# Patient Record
Sex: Female | Born: 1935 | Race: White | Hispanic: No | Marital: Married
Health system: Southern US, Community
[De-identification: ages and names within clinical notes are randomized; demographics above are authoritative.]

---

## 2003-11-07 ENCOUNTER — Other Ambulatory Visit: Payer: Self-pay

## 2003-11-07 ENCOUNTER — Inpatient Hospital Stay: Payer: Self-pay | Admitting: Internal Medicine

## 2004-03-10 ENCOUNTER — Emergency Department: Payer: Self-pay | Admitting: Emergency Medicine

## 2004-04-14 ENCOUNTER — Ambulatory Visit: Payer: Self-pay | Admitting: Internal Medicine

## 2004-10-02 ENCOUNTER — Ambulatory Visit: Payer: Self-pay | Admitting: Internal Medicine

## 2006-09-09 ENCOUNTER — Ambulatory Visit: Payer: Self-pay | Admitting: Emergency Medicine

## 2006-09-09 ENCOUNTER — Other Ambulatory Visit: Payer: Self-pay

## 2006-09-09 ENCOUNTER — Emergency Department: Payer: Self-pay | Admitting: Emergency Medicine

## 2008-05-04 ENCOUNTER — Emergency Department: Payer: Self-pay | Admitting: Emergency Medicine

## 2008-12-11 ENCOUNTER — Emergency Department (HOSPITAL_COMMUNITY): Admission: EM | Admit: 2008-12-11 | Discharge: 2008-12-12 | Payer: Self-pay | Admitting: Emergency Medicine

## 2008-12-11 ENCOUNTER — Emergency Department: Payer: Self-pay | Admitting: Internal Medicine

## 2009-02-12 ENCOUNTER — Ambulatory Visit: Payer: Self-pay | Admitting: Internal Medicine

## 2009-02-12 ENCOUNTER — Observation Stay (HOSPITAL_COMMUNITY): Admission: EM | Admit: 2009-02-12 | Discharge: 2009-02-13 | Payer: Self-pay | Admitting: Emergency Medicine

## 2009-06-25 ENCOUNTER — Emergency Department (HOSPITAL_COMMUNITY): Admission: EM | Admit: 2009-06-25 | Discharge: 2009-06-25 | Payer: Self-pay | Admitting: Emergency Medicine

## 2009-06-26 ENCOUNTER — Emergency Department: Payer: Self-pay | Admitting: Emergency Medicine

## 2009-07-06 ENCOUNTER — Emergency Department: Payer: Self-pay | Admitting: Emergency Medicine

## 2009-08-19 ENCOUNTER — Emergency Department (HOSPITAL_COMMUNITY): Admission: EM | Admit: 2009-08-19 | Discharge: 2009-08-20 | Payer: Self-pay | Admitting: Emergency Medicine

## 2010-04-04 LAB — CBC
Hemoglobin: 12.4 g/dL (ref 12.0–15.0)
MCHC: 32.5 g/dL (ref 30.0–36.0)
Platelets: 265 10*3/uL (ref 150–400)
RBC: 4.07 MIL/uL (ref 3.87–5.11)
RDW: 15 % (ref 11.5–15.5)
WBC: 8.9 10*3/uL (ref 4.0–10.5)

## 2010-04-04 LAB — POCT I-STAT, CHEM 8
Calcium, Ion: 1.04 mmol/L — ABNORMAL LOW (ref 1.12–1.32)
Glucose, Bld: 102 mg/dL — ABNORMAL HIGH (ref 70–99)
HCT: 40 % (ref 36.0–46.0)
TCO2: 28 mmol/L (ref 0–100)

## 2010-04-04 LAB — URINALYSIS, ROUTINE W REFLEX MICROSCOPIC
Glucose, UA: NEGATIVE mg/dL
Nitrite: NEGATIVE
Specific Gravity, Urine: 1.007 (ref 1.005–1.030)
pH: 8 (ref 5.0–8.0)

## 2010-04-04 LAB — DIFFERENTIAL
Lymphocytes Relative: 18 % (ref 12–46)
Lymphs Abs: 1.6 10*3/uL (ref 0.7–4.0)
Neutro Abs: 6.4 10*3/uL (ref 1.7–7.7)
Neutrophils Relative %: 72 % (ref 43–77)

## 2010-04-04 LAB — POCT CARDIAC MARKERS
CKMB, poc: 1.4 ng/mL (ref 1.0–8.0)
Myoglobin, poc: 69.9 ng/mL (ref 12–200)
Troponin i, poc: <0.05 ng/mL (ref 0.00–0.09)

## 2010-04-07 LAB — URINALYSIS, ROUTINE W REFLEX MICROSCOPIC
Bilirubin Urine: NEGATIVE
Bilirubin Urine: NEGATIVE
Glucose, UA: NEGATIVE mg/dL
Hgb urine dipstick: NEGATIVE
Ketones, ur: NEGATIVE mg/dL
Ketones, ur: NEGATIVE mg/dL
Nitrite: POSITIVE — AB
Protein, ur: NEGATIVE mg/dL
Specific Gravity, Urine: 1.014 (ref 1.005–1.030)
Urobilinogen, UA: 1 mg/dL (ref 0.0–1.0)
pH: 7.5 (ref 5.0–8.0)

## 2010-04-07 LAB — LIPID PANEL
Cholesterol: 233 mg/dL — ABNORMAL HIGH (ref 0–200)
LDL Cholesterol: 148 mg/dL — ABNORMAL HIGH (ref 0–99)
Total CHOL/HDL Ratio: 3.1 RATIO
VLDL: 10 mg/dL (ref 0–40)

## 2010-04-07 LAB — CBC
HCT: 36 % (ref 36.0–46.0)
Hemoglobin: 11.3 g/dL — ABNORMAL LOW (ref 12.0–15.0)
MCHC: 33.3 g/dL (ref 30.0–36.0)
MCV: 94.9 fL (ref 78.0–100.0)
MCV: 94 fL (ref 78.0–100.0)
Platelets: 233 10*3/uL (ref 150–400)
RBC: 3.61 MIL/uL — ABNORMAL LOW (ref 3.87–5.11)
RDW: 17.5 % — ABNORMAL HIGH (ref 11.5–15.5)
RDW: 14.8 % (ref 11.5–15.5)
WBC: 5.6 10*3/uL (ref 4.0–10.5)

## 2010-04-07 LAB — DIFFERENTIAL
Basophils Absolute: 0 10*3/uL (ref 0.0–0.1)
Eosinophils Absolute: 0.2 10*3/uL (ref 0.0–0.7)
Eosinophils Relative: 3 % (ref 0–5)
Lymphocytes Relative: 37 % (ref 12–46)
Monocytes Absolute: 0.5 10*3/uL (ref 0.1–1.0)

## 2010-04-07 LAB — POCT I-STAT, CHEM 8
BUN: 18 mg/dL (ref 6–23)
Creatinine, Ser: 0.6 mg/dL (ref 0.4–1.2)
Creatinine, Ser: 0.7 mg/dL (ref 0.4–1.2)
Glucose, Bld: 81 mg/dL (ref 70–99)
Glucose, Bld: 89 mg/dL (ref 70–99)
HCT: 36 % (ref 36.0–46.0)
Hemoglobin: 13.3 g/dL (ref 12.0–15.0)
Hemoglobin: 12.2 g/dL (ref 12.0–15.0)
Potassium: 3.6 mEq/L (ref 3.5–5.1)
Sodium: 140 mEq/L (ref 135–145)
Sodium: 138 mEq/L (ref 135–145)
TCO2: 31 mmol/L (ref 0–100)

## 2010-04-07 LAB — CARDIAC PANEL(CRET KIN+CKTOT+MB+TROPI)
CK, MB: 2.2 ng/mL (ref 0.3–4.0)
Relative Index: INVALID (ref 0.0–2.5)
Relative Index: INVALID (ref 0.0–2.5)
Total CK: 84 U/L (ref 7–177)

## 2010-04-07 LAB — URINE CULTURE: Colony Count: NO GROWTH

## 2010-04-07 LAB — HEPATIC FUNCTION PANEL
Albumin: 3.9 g/dL (ref 3.5–5.2)
Indirect Bilirubin: 0.7 mg/dL (ref 0.3–0.9)
Total Protein: 7.2 g/dL (ref 6.0–8.3)

## 2010-04-07 LAB — POCT CARDIAC MARKERS
CKMB, poc: 2.2 ng/mL (ref 1.0–8.0)
Myoglobin, poc: 63.5 ng/mL (ref 12–200)

## 2010-04-07 LAB — URINE MICROSCOPIC-ADD ON

## 2010-04-09 ENCOUNTER — Inpatient Hospital Stay: Payer: Self-pay | Admitting: Internal Medicine

## 2010-08-29 IMAGING — CT CT HEAD W/O CM
1 of 2 series · 16 of 30 positions shown, 20 images · non-contrast
Comparison: None.

CLINICAL DATA: Fall with head injury.  Weakness.

CT HEAD WITHOUT CONTRAST
TECHNIQUE: Contiguous axial images were obtained from the base of
the skull through the vertex without contrast.

[Series 3: recon 2: brain · axial · 0.47mm/px · z∈[+117,+241]mm · 16 of 96 slices shown, 20 images]
[im 6/96  brain]
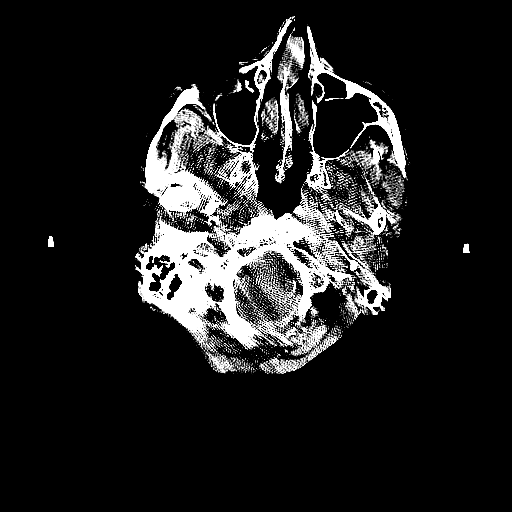
[im 6/96  bone]
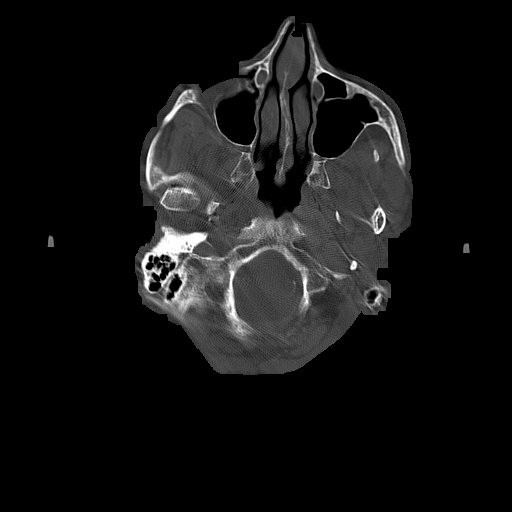
[im 11/96  brain]
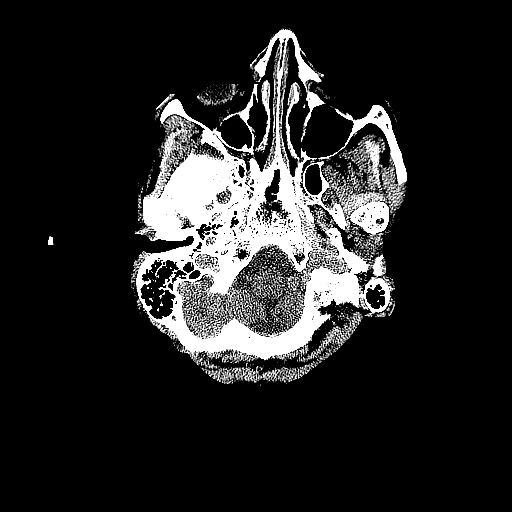
[im 16/96  brain]
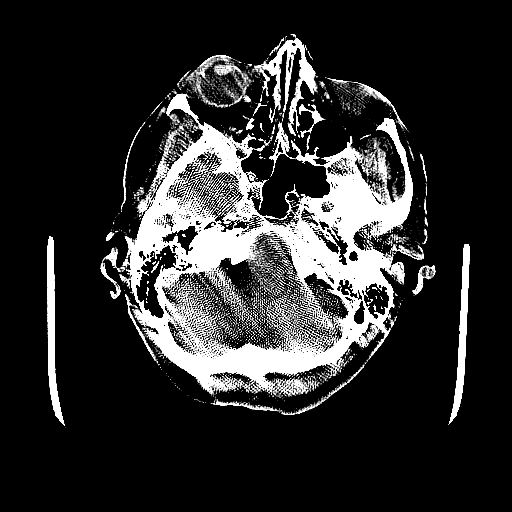
[im 21/96  brain]
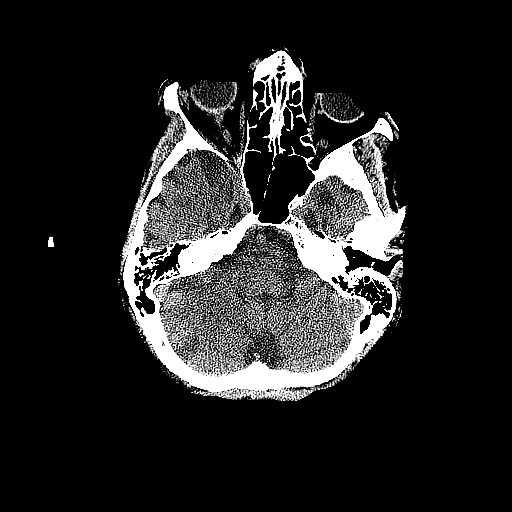
[im 31/96  brain]
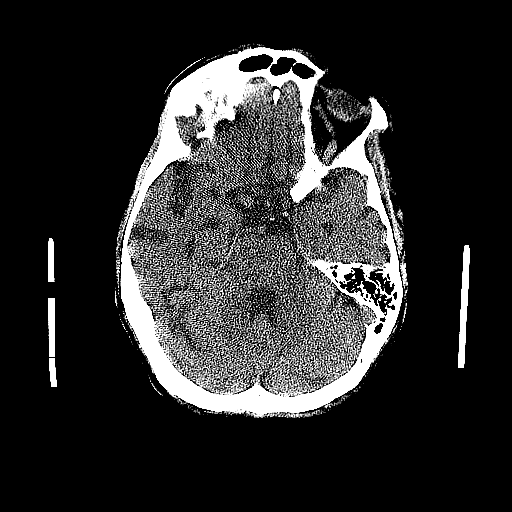
[im 31/96  bone]
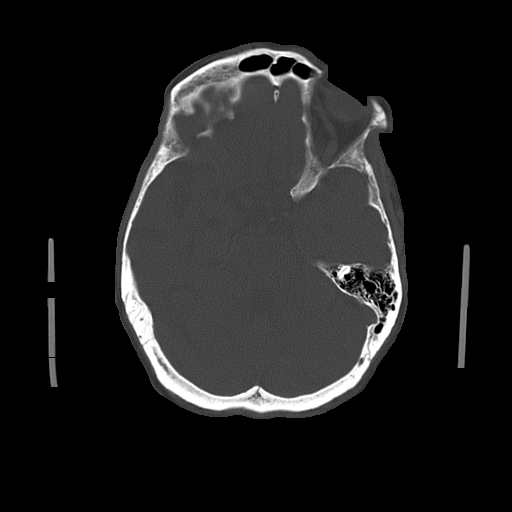
[im 36/96  brain]
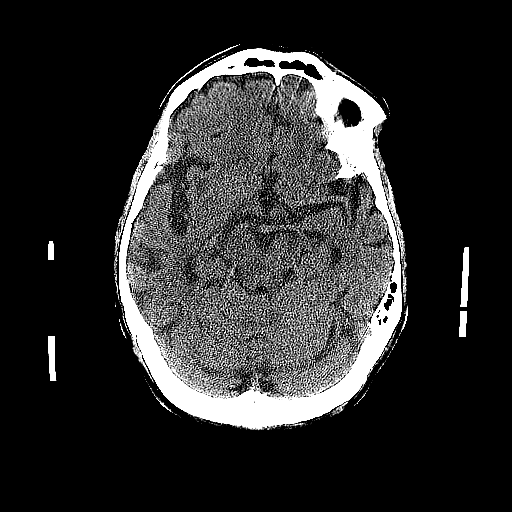
[im 41/96  brain]
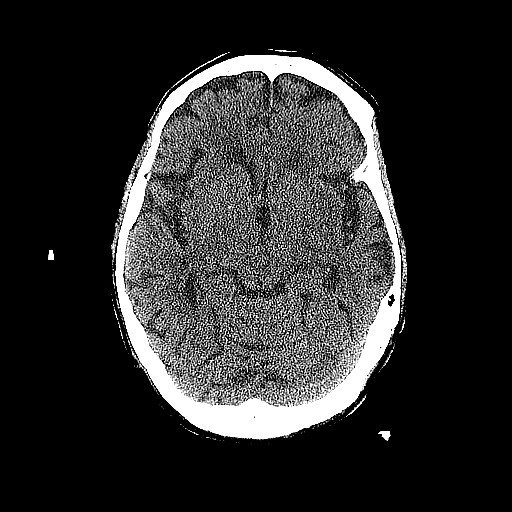
[im 46/96  brain]
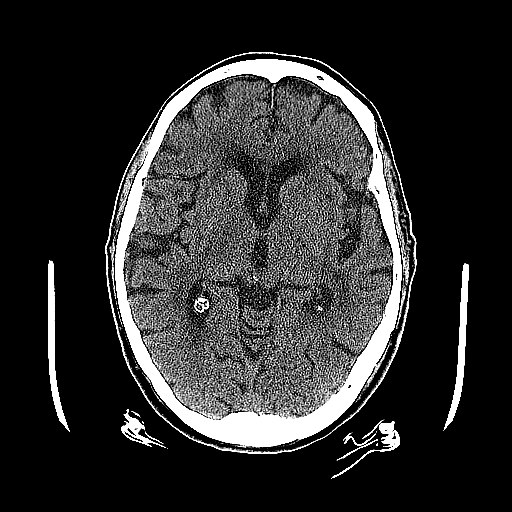
[im 51/96  brain]
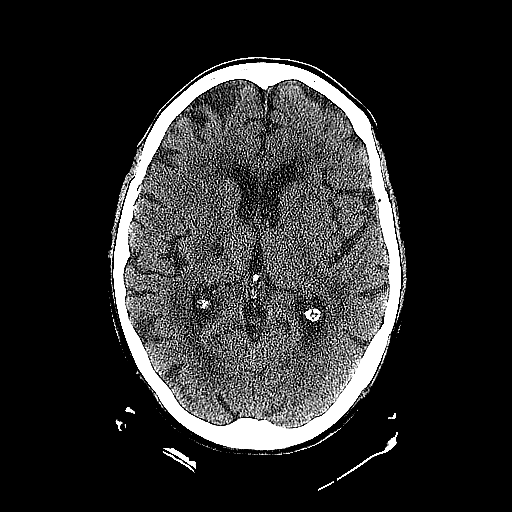
[im 51/96  bone]
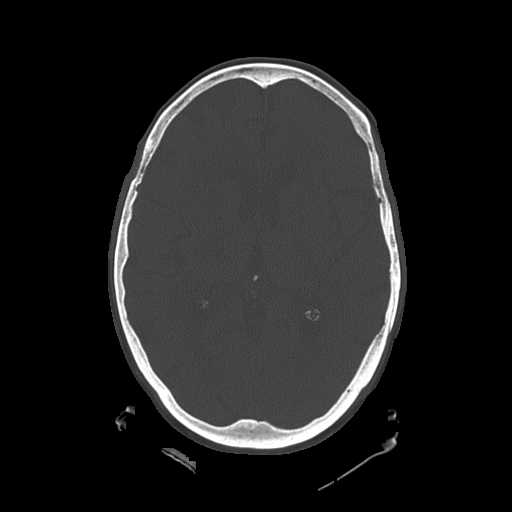
[im 56/96  brain]
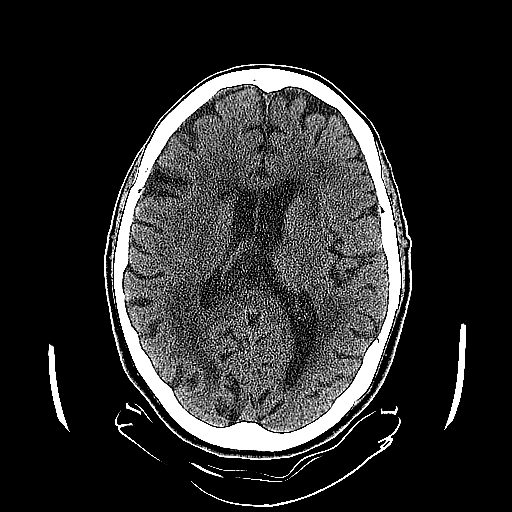
[im 61/96  brain]
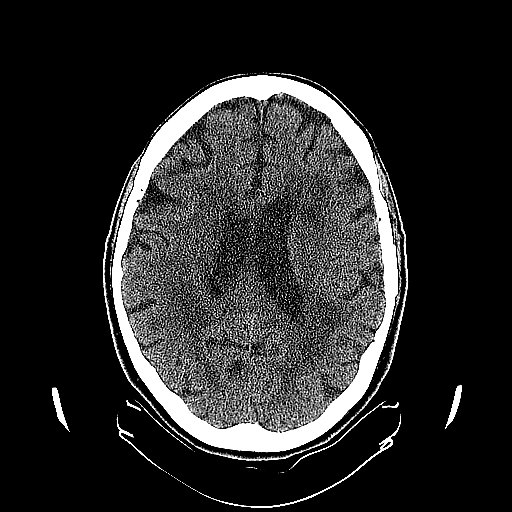
[im 66/96  brain]
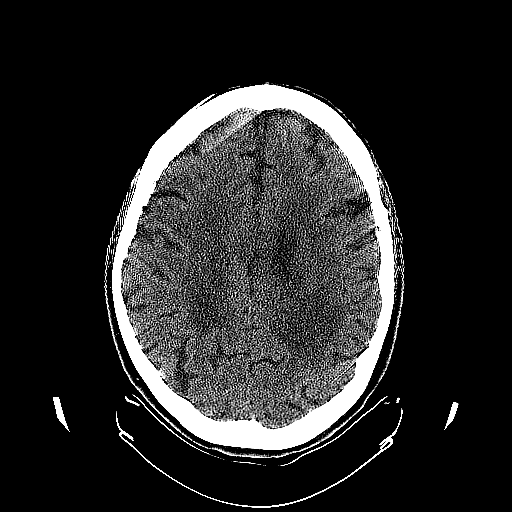
[im 76/96  brain]
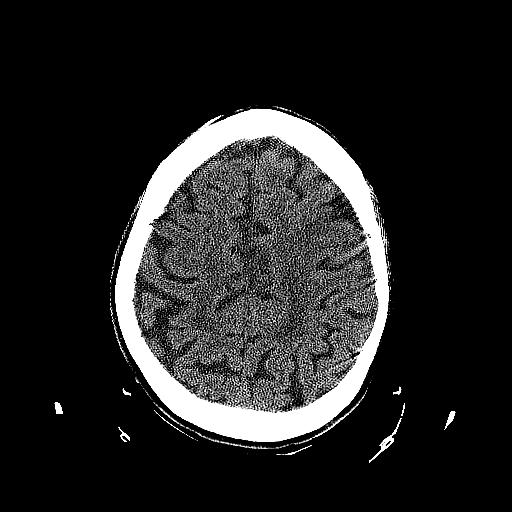
[im 76/96  bone]
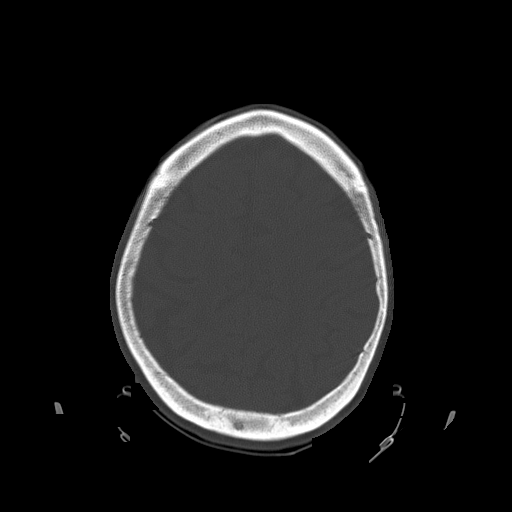
[im 81/96  brain]
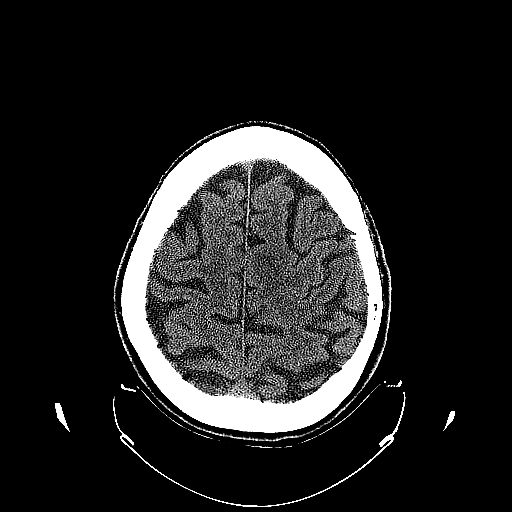
[im 86/96  brain]
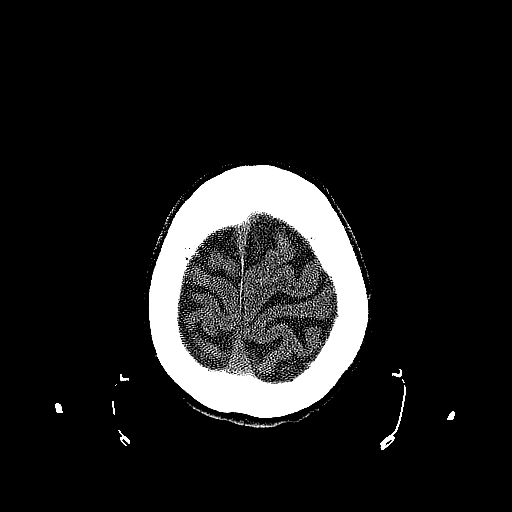
[im 91/96  brain]
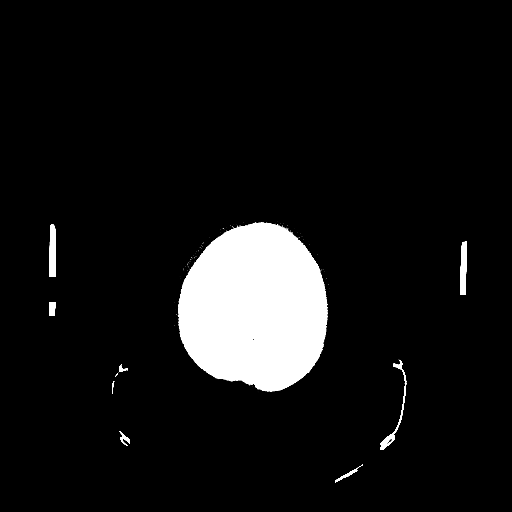

[16 of 30 positions shown; findings below may reference images not displayed]

FINDINGS: No evidence of acute hemorrhage, extra-axial fluid
collection, mass effect or hydrocephalus.  No evidence of
infarction or mass lesion.  Advanced small vessel ischemic disease
is present in the periventricular white matter.  No evidence of
skull fracture.
IMPRESSION: No acute findings.  Advanced small vessel disease.

## 2012-02-01 ENCOUNTER — Ambulatory Visit: Payer: Self-pay | Admitting: Vascular Surgery

## 2012-02-01 LAB — CREATININE, SERUM
Creatinine: 0.7 mg/dL (ref 0.60–1.30)
EGFR (Non-African Amer.): >60

## 2012-02-10 ENCOUNTER — Emergency Department: Payer: Self-pay | Admitting: Emergency Medicine

## 2012-02-10 ENCOUNTER — Inpatient Hospital Stay: Admission: AD | Admit: 2012-02-10 | Payer: Self-pay | Source: Other Acute Inpatient Hospital | Admitting: Neurology

## 2012-02-10 LAB — COMPREHENSIVE METABOLIC PANEL
Alkaline Phosphatase: 107 U/L (ref 50–136)
Co2: 30 mmol/L (ref 21–32)
Creatinine: 0.62 mg/dL (ref 0.60–1.30)
EGFR (African American): >60
EGFR (Non-African Amer.): >60
Osmolality: 273 (ref 275–301)
Potassium: 4.1 mmol/L (ref 3.5–5.1)
SGPT (ALT): 19 U/L (ref 12–78)
Sodium: 136 mmol/L (ref 136–145)
Total Protein: 7.2 g/dL (ref 6.4–8.2)

## 2012-02-10 LAB — URINALYSIS, COMPLETE
Blood: NEGATIVE
Glucose,UR: NEGATIVE mg/dL (ref 0–75)
Nitrite: NEGATIVE
Ph: 6 (ref 4.5–8.0)
RBC,UR: 4 /HPF (ref 0–5)
Squamous Epithelial: NONE SEEN

## 2012-02-10 LAB — CBC
HCT: 30.5 % — ABNORMAL LOW (ref 35.0–47.0)
HGB: 10.5 g/dL — ABNORMAL LOW (ref 12.0–16.0)
MCH: 31.9 pg (ref 26.0–34.0)
MCHC: 34.2 g/dL (ref 32.0–36.0)
Platelet: 323 10*3/uL (ref 150–440)
RDW: 14.5 % (ref 11.5–14.5)

## 2012-02-10 LAB — CK TOTAL AND CKMB (NOT AT ARMC): CK-MB: 2.5 ng/mL (ref 0.5–3.6)

## 2012-02-10 LAB — PROTIME-INR: INR: 1

## 2012-04-19 DEATH — deceased

## 2014-05-11 NOTE — Op Note (Signed)
PATIENT NAME:  Megan Mata, Megan Mata MR#:  [id number] DATE OF BIRTH:  09/13/1921  DATE OF PROCEDURE:  02/01/2012  PREOPERATIVE DIAGNOSES:  1.  Peripheral arterial disease with ulceration, left lower extremity.  2.  Dementia.  3.  Severe poorly controlled hypertension.   POSTOPERATIVE DIAGNOSES: 1.  Peripheral arterial disease with ulceration, left lower extremity.  2.  Dementia.  3.  Severe poorly controlled hypertension.   PROCEDURES: 1.  Catheter placement into left peroneal artery from right femoral approach.  2.  Aortogram and selective left lower extremity angiogram.  3.  Percutaneous transluminal angioplasty of left tibioperoneal trunk and peroneal artery with 3 mm diameter angioplasty balloon.  4.  Percutaneous transluminal angioplasty of left SFA and popliteal artery with a 5 mm diameter angioplasty balloon.  5.  Viabahn stent placement with 5 mm diameter by 15 mm length Viabahn stent. This was to the left SFA and above-knee popliteal artery.  6.  StarClose closure device, right femoral artery.   SURGEON:  Annice NeedyJason S Erikka Follmer, M.D.   ANESTHESIA:  Local with moderate conscious sedation.   ESTIMATED BLOOD LOSS:  Approximately 25 mL.   INDICATION FOR PROCEDURE:  This is an 81 year old white female with limb threatening ischemia of the left lower extremity. She has a nonhealing ulceration for about 6 months. This is extremely painful. She is brought in for an attempt at revascularization due to the severe arterial insufficiency present.   DESCRIPTION OF THE PROCEDURE: The patient is brought to the vascular interventional radiology suite. Groin was shaved and prepped and a sterile surgical field was created. The right femoral head was localized with fluoroscopy, and the right femoral artery was accessed without difficulty with a Seldinger needle. A J-wire and 5-French sheath were placed. Pigtail catheter was placed in the aorta at the L1-L2 level, and AP aortogram was performed. 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 The right renal artery was very low lying, and the right kidney was basically in the pelvis. The left kidney was of a normal configuration. The renal arteries appear patent. The aorta and iliac segments were patent. I then hooked the aortic  bifurcation, and advanced to the left femoral head and selective left lower extremity angiogram was then performed. This showed an occlusion of the SFA proximally near its origin. She had a heavily diseased below-knee popliteal artery where she did reconstitute, but then had some stenosis in the mid popliteal artery just above the knee. Below that, the tibioperoneal trunk was occluded. The peroneal artery was occluded proximally, but it was the best runoff distally. The anterior tibial artery was patent over the first 8 to 10 cm but then occluded. The posterior tibial artery was chronically occluded. The patient was systemically heparinized. A 6-French Ansel sheath was placed over a Terumo Advantage wire. I was able to cross the SFA and popliteal lesion with surprisingly little difficulty with the Terumo Advantage wire and a CXI catheter. I confirmed intraluminal flow in the popliteal artery. I then exchanged for an 0.018 wire was able to traverse into the peroneal artery, crossing the tibioperoneal and peroneal occlusions. A 3 mm diameter angioplasty balloon was inflated in the tibioperoneal trunk, peroneal artery, and also used to predilate the tightest, and there is calcified lesion in the popliteal and distal SFA. After this, I took a 5 mm diameter angioplasty balloon in the popliteal artery, up the SFA, up to its origin. The proximal mid SFA actually looked pretty good after angioplasty,  but the distal superficial femoral artery and above-knee popliteal artery had dissection and high-grade residual stenosis. The tibioperoneal trunk and peroneal artery were patent and continuous distally. A 5 mm Viabahn stent was  then selected, taken from just above the knee, up to just above Hunter's canal, and ironed out with 5 mm balloon with excellent angiographic completion result. The sheath was then pulled back ipsilateral external iliac artery and oblique arteriogram was performed. StarClose closure device was deployed in the usual fashion with excellent hemostatic result. The patient tolerated the procedure well and was taken to the recovery room in stable condition.    ____________________________ Annice Needy, MD [id number] D: 02/01/2012 15:23:12 ET T: 02/01/2012 22:02:49 ET JOB#: [id number]  cc: Annice Needy, MD, <Dictator> Annice Needy MD ELECTRONICALLY SIGNED 02/08/2012 8:29
